# Patient Record
Sex: Male | Born: 1995 | Hispanic: Refuse to answer | Marital: Single | Smoking: Never smoker
Health system: Southern US, Community
[De-identification: ages and names within clinical notes are randomized; demographics above are authoritative.]

---

## 2016-07-10 ENCOUNTER — Ambulatory Visit (INDEPENDENT_AMBULATORY_CARE_PROVIDER_SITE_OTHER): Payer: BLUE CROSS/BLUE SHIELD

## 2016-07-10 ENCOUNTER — Ambulatory Visit (INDEPENDENT_AMBULATORY_CARE_PROVIDER_SITE_OTHER): Payer: BLUE CROSS/BLUE SHIELD | Admitting: Physician Assistant

## 2016-07-10 ENCOUNTER — Encounter: Payer: Self-pay | Admitting: Physician Assistant

## 2016-07-10 VITALS — BP 136/78 | HR 54 | Temp 98.3°F | Resp 18 | Ht 73.23 in | Wt 200.0 lb

## 2016-07-10 DIAGNOSIS — J181 Lobar pneumonia, unspecified organism: Secondary | ICD-10-CM

## 2016-07-10 DIAGNOSIS — R05 Cough: Secondary | ICD-10-CM | POA: Diagnosis not present

## 2016-07-10 DIAGNOSIS — R509 Fever, unspecified: Secondary | ICD-10-CM

## 2016-07-10 DIAGNOSIS — J189 Pneumonia, unspecified organism: Secondary | ICD-10-CM

## 2016-07-10 DIAGNOSIS — R059 Cough, unspecified: Secondary | ICD-10-CM

## 2016-07-10 LAB — POCT CBC
GRANULOCYTE PERCENT: 64.1 % (ref 37–80)
HEMATOCRIT: 42.1 % — AB (ref 43.5–53.7)
HEMOGLOBIN: 14.2 g/dL (ref 14.1–18.1)
Lymph, poc: 1.6 (ref 0.6–3.4)
MCH: 30.5 pg (ref 27–31.2)
MCHC: 33.7 g/dL (ref 31.8–35.4)
MCV: 90.6 fL (ref 80–97)
MID (cbc): 0.5 (ref 0–0.9)
MPV: 7.3 fL (ref 0–99.8)
POC GRANULOCYTE: 3.8 (ref 2–6.9)
POC LYMPH PERCENT: 27.5 %L (ref 10–50)
POC MID %: 8.4 % (ref 0–12)
Platelet Count, POC: 205 10*3/uL (ref 142–424)
RBC: 4.65 M/uL — AB (ref 4.69–6.13)
RDW, POC: 13.1 %
WBC: 5.9 10*3/uL (ref 4.6–10.2)

## 2016-07-10 MED ORDER — AZITHROMYCIN 250 MG PO TABS: ORAL_TABLET | ORAL | 0 refills | Status: AC

## 2016-07-10 MED ORDER — CEFTRIAXONE SODIUM 1 G IJ SOLR
1.0000 g | Freq: Once | INTRAMUSCULAR | Status: AC
  Administered 2016-07-10: 1 g via INTRAMUSCULAR

## 2016-07-10 NOTE — Progress Notes (Signed)
07/10/2016 4:33 PM   DOB: 12/21/1995 / MRN: [id number]  SUBJECTIVE:  Tony Cooper is a 21 y.o. male presenting for weakness that started on 4 days ago.  Tells me this was followed by a high tactile fever and fatigue.  He was able to go back to work but continued to have myalgia and fatigue. Felt the fever coming back yesterday.  Associates cough and also feels that he is somewhat shortness of breath with cough and with exertion. Denies posterior calf pain and leg swelling.  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   He has No Known Allergies.   He  has no past medical history on file.    He  reports that he has never smoked. He has never used smokeless tobacco. He reports that he does not drink alcohol or use drugs. He  has no sexual activity history on file. The patient  has no past surgical history on file.  His family history is not on file.  Review of Systems  Constitutional: Positive for chills, fever and malaise/fatigue.  Respiratory: Negative for cough.   Cardiovascular: Negative for chest pain and leg swelling.  Skin: Negative for rash.  Neurological: Negative for dizziness.    The problem list and medications were reviewed and updated by myself where necessary and exist elsewhere in the encounter.   OBJECTIVE:  BP 136/78 (BP Location: Right Arm, Patient Position: Sitting, Cuff Size: Small)   Pulse (!) 54   Temp 98.3 F (36.8 C) (Oral)   Resp 18   Ht 6' 1.23" (1.86 m)   Wt 200 lb (90.7 kg)   SpO2 97%   BMI 26.22 kg/m   Physical Exam  Constitutional: He appears well-developed. He is active and cooperative.  Non-toxic appearance.  Cardiovascular: Normal rate, regular rhythm, S1 normal, S2 normal, normal heart sounds, intact distal pulses and normal pulses.  Exam reveals no gallop and no friction rub.   No murmur heard. Pulmonary/Chest: Effort normal. No stridor. No tachypnea. No respiratory distress. He has no wheezes. He has no rales.  Abdominal: He  exhibits no distension.  Musculoskeletal: He exhibits no edema.  Neurological: He is alert.  Skin: Skin is warm and dry. He is not diaphoretic. No pallor.  Vitals reviewed.   Results for orders placed or performed in visit on 07/10/16 (from the past 72 hour(s))  POCT CBC     Status: Abnormal   Collection Time: 07/10/16  3:43 PM  Result Value Ref Range   WBC 5.9 4.6 - 10.2 K/uL   Lymph, poc 1.6 0.6 - 3.4   POC LYMPH PERCENT 27.5 10 - 50 %L   MID (cbc) 0.5 0 - 0.9   POC MID % 8.4 0 - 12 %M   POC Granulocyte 3.8 2 - 6.9   Granulocyte percent 64.1 37 - 80 %G   RBC 4.65 (A) 4.69 - 6.13 M/uL   Hemoglobin 14.2 14.1 - 18.1 g/dL   HCT, POC 40.942.1 (A) 81.143.5 - 53.7 %   MCV 90.6 80 - 97 fL   MCH, POC 30.5 27 - 31.2 pg   MCHC 33.7 31.8 - 35.4 g/dL   RDW, POC 91.413.1 %   Platelet Count, POC 205 142 - 424 K/uL   MPV 7.3 0 - 99.8 fL    Dg Chest 2 View  Result Date: 07/10/2016 CLINICAL DATA:  Cough with fever and chills. EXAM: CHEST  2 VIEW COMPARISON:  None. FINDINGS: Patchy densities in the left upper lung  is suggestive for pneumonia. Right lung is clear. Heart and mediastinum are within normal limits. The trachea is midline. No pleural effusions. Bone structures are normal. IMPRESSION: Left upper lobe pneumonia. Electronically Signed   By: Richarda Overlie M.D.   On: 07/10/2016 15:40    ASSESSMENT AND PLAN:  [name] was seen today for cough and shortness of breath.  Diagnoses and all orders for this visit:  Cough -     DG Chest 2 View; Future  Fever, unspecified -     POCT CBC  Pneumonia of left upper lobe due to infectious organism [facility]): Advised that if he is not feeling significantly better in the next 72 hours that I would need to see him back.  Fortunately his vitals look great and CBC is unremarkable.   -     cefTRIAXone (ROCEPHIN) injection 1 g; Inject 1 g into the muscle once. -     azithromycin (ZITHROMAX) 250 MG tablet; Take 2 tabs PO x 1 dose, then 1 tab PO QD x 4 days    The  patient is advised to call or return to clinic if he does not see an improvement in symptoms, or to seek the care of the closest emergency department if he worsens with the above plan.   Deliah Boston, MHS, PA-C [facility] 07/10/2016 4:33 PM

## 2016-07-10 NOTE — Patient Instructions (Addendum)
Wait at least 48 hours before getting back to the field. If you are not feeling completely better in a week then please see the team physician or come back and see me or one of my colleagues.     Dg Chest 2 View  Result Date: 07/10/2016 CLINICAL DATA:  Cough with fever and chills. EXAM: CHEST  2 VIEW COMPARISON:  None. FINDINGS: Patchy densities in the left upper lung is suggestive for pneumonia. Right lung is clear. Heart and mediastinum are within normal limits. The trachea is midline. No pleural effusions. Bone structures are normal. IMPRESSION: Left upper lobe pneumonia. Electronically Signed   By: Richarda OverlieAdam  Henn M.D.   On: 07/10/2016 15:40     IF you received an x-ray today, you will receive an invoice from [facility]. Please contact [facility] at [phone] with questions or concerns regarding your invoice.   IF you received labwork today, you will receive an invoice from [facility]. Please contact [facility] at [phone] with questions or concerns regarding your invoice.   Our billing staff will not be able to assist you with questions regarding bills from these companies.  You will be contacted with the lab results as soon as they are available. The fastest way to get your results is to activate your My Chart account. Instructions are located on the last page of this paperwork. If you have not heard from us regarding the results in 2 weeks, please contact this office.

## 2017-12-18 IMAGING — DX DG CHEST 2V
3 series · 3 of 3 positions shown · non-contrast
Comparison: None.

CLINICAL DATA: Cough with fever and chills.

EXAM:
CHEST  2 VIEW

[chest pa (1 of 2)]
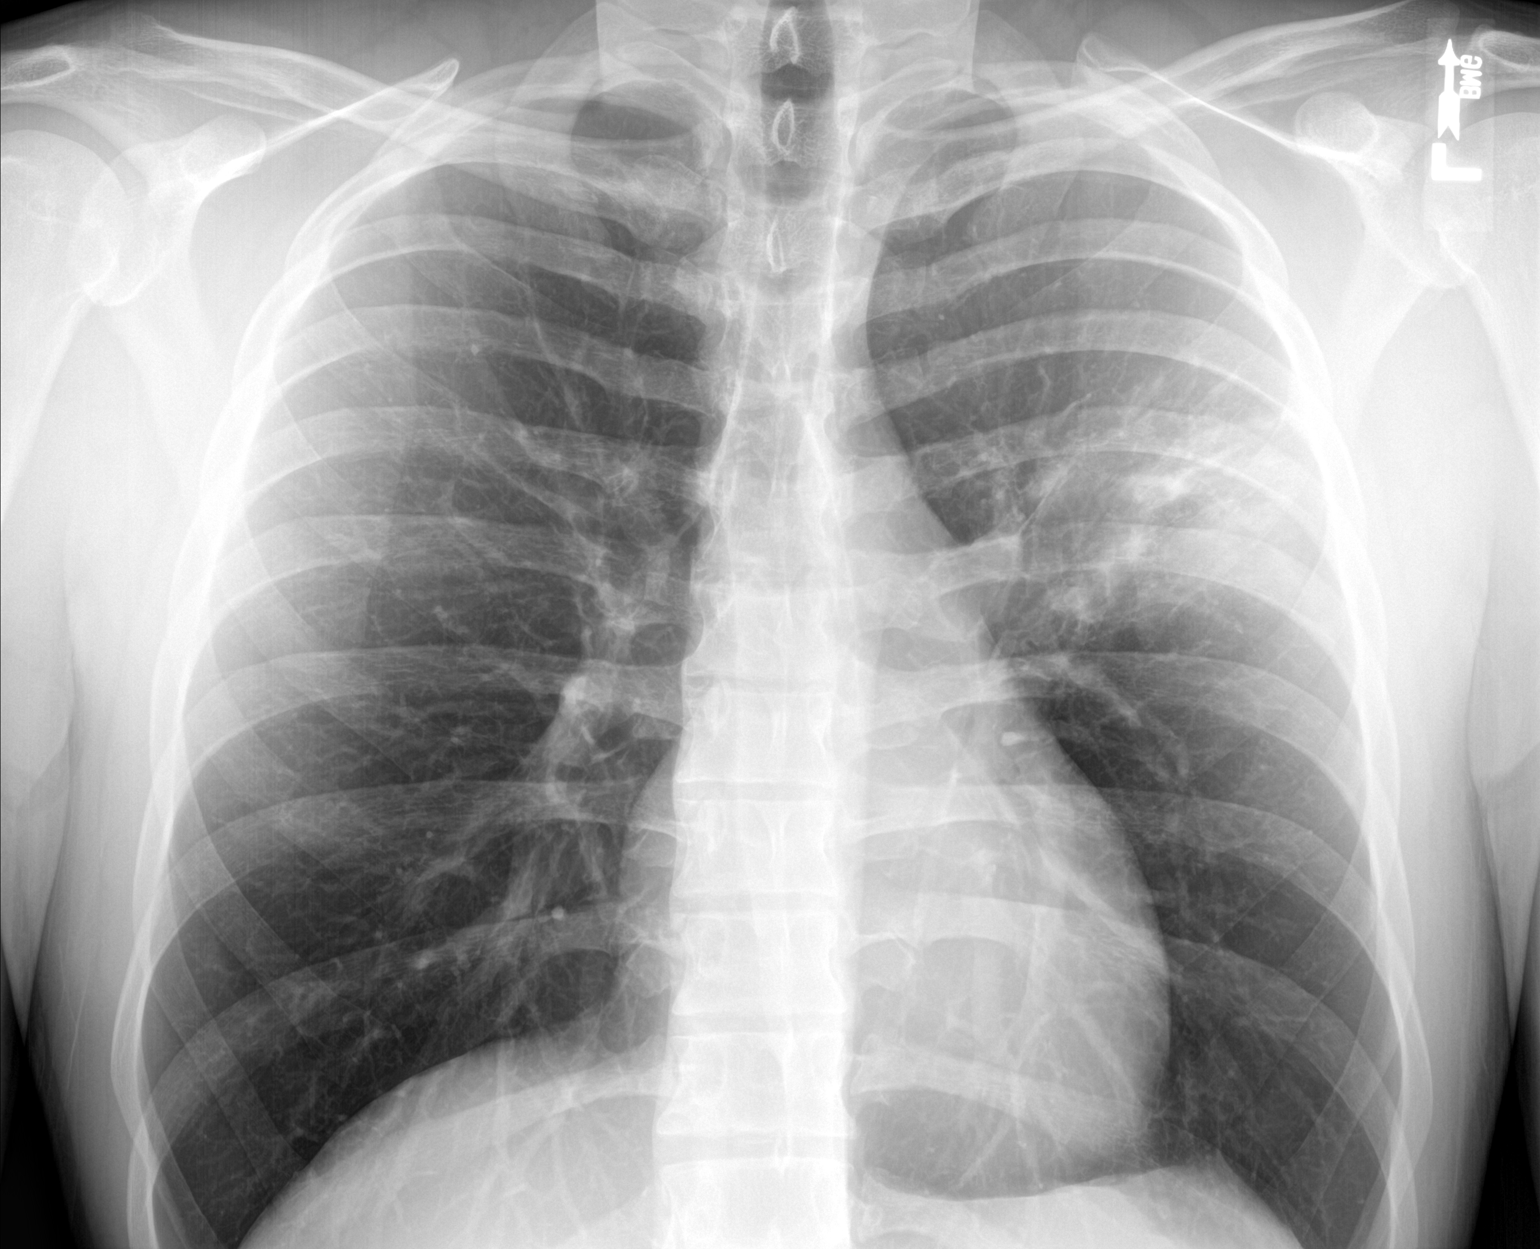

[chest lat]
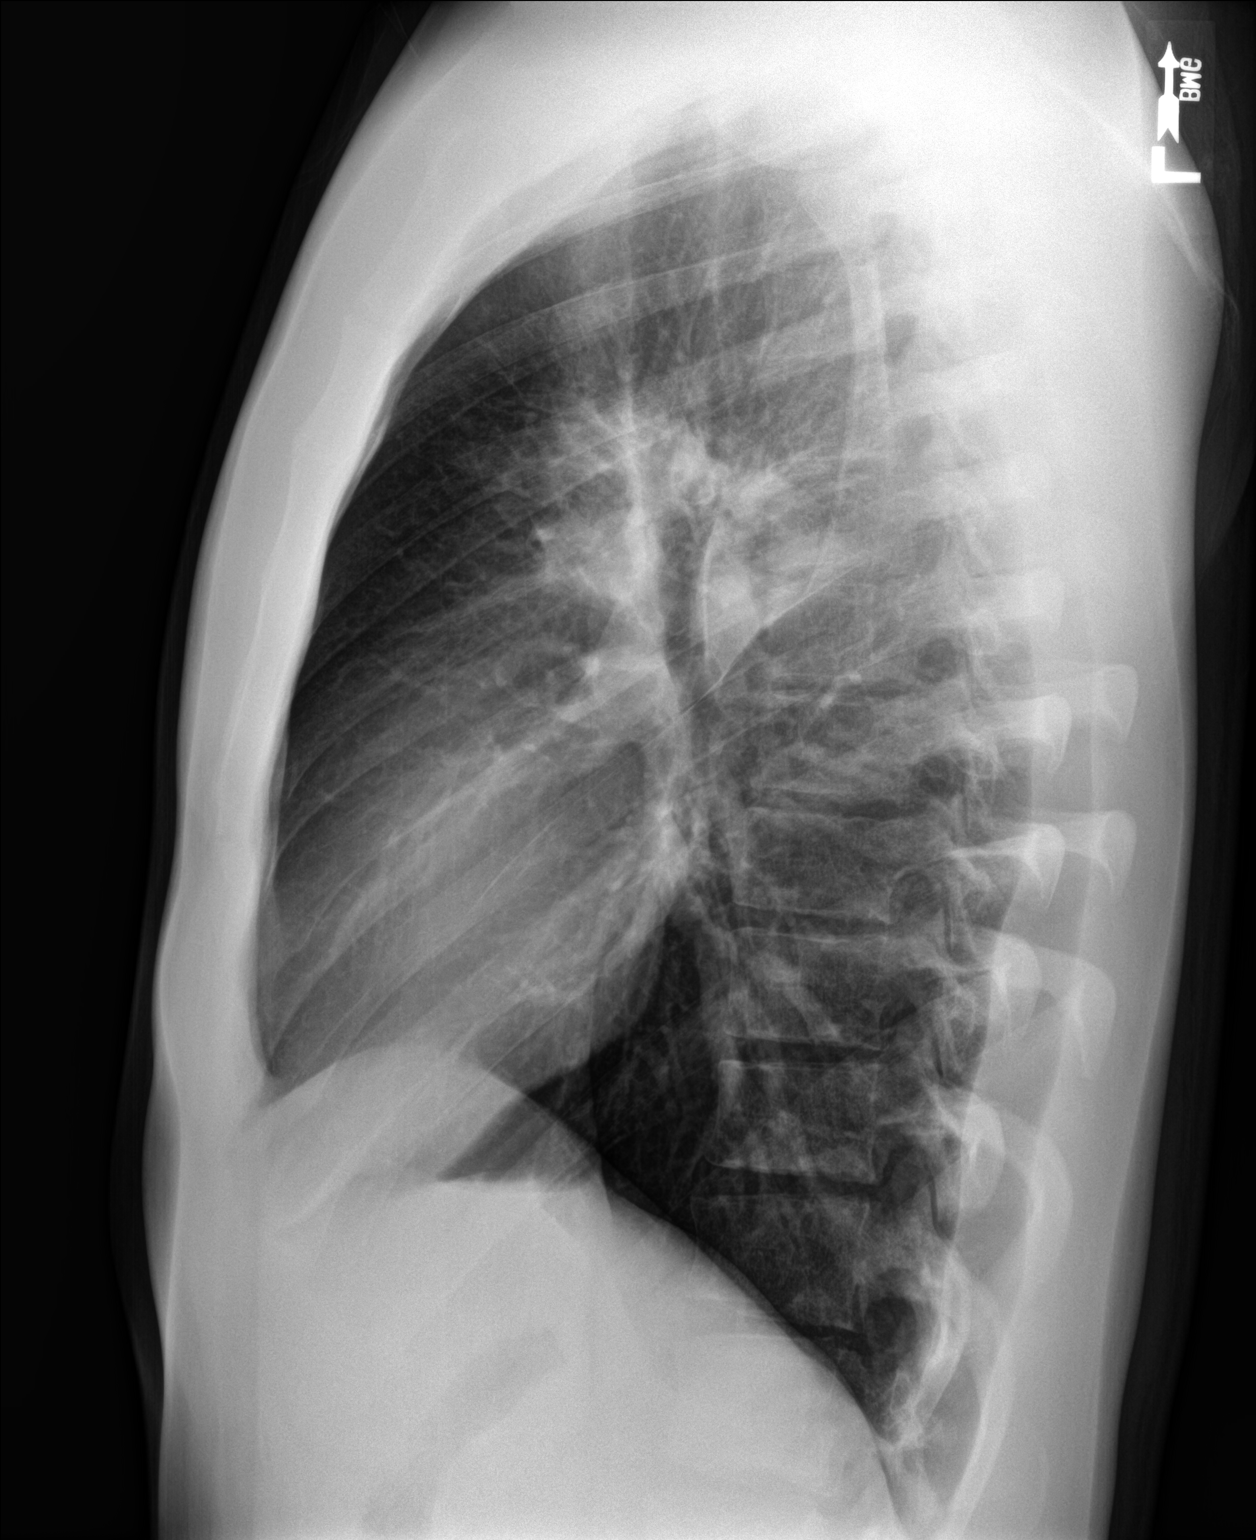

[chest pa (2 of 2)]
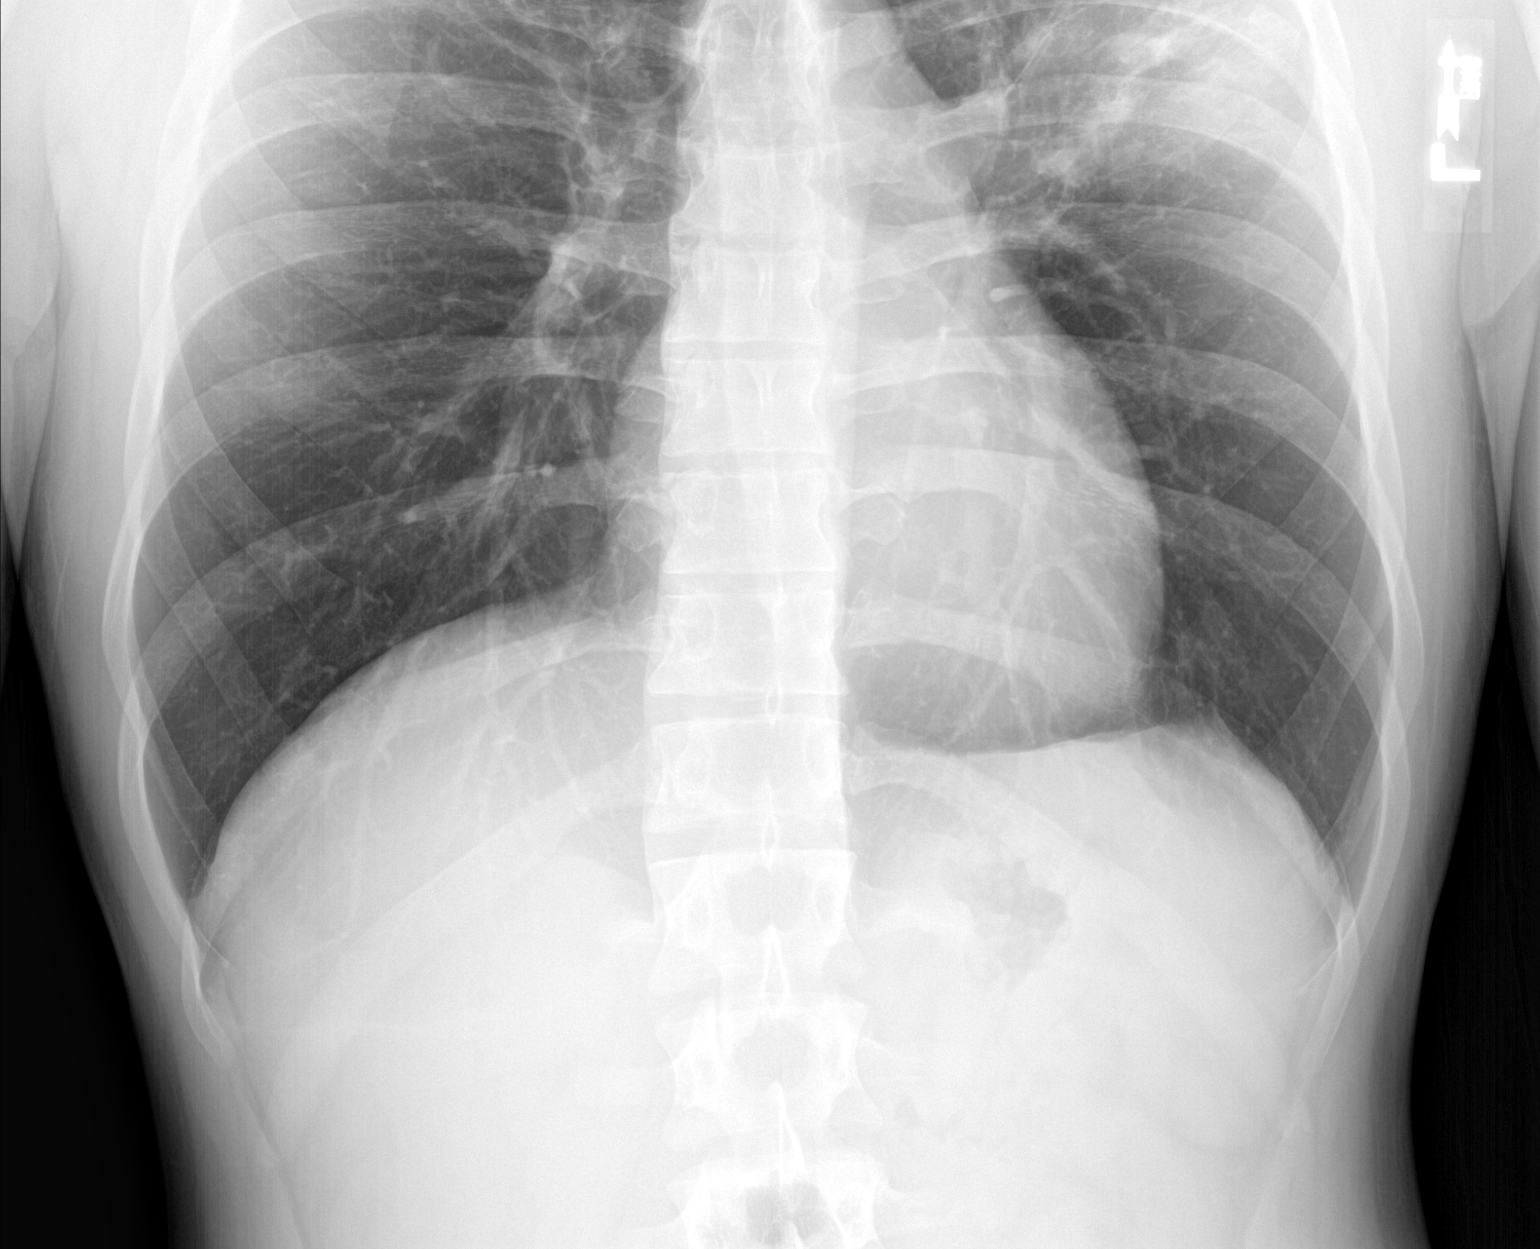

[3 of 3 positions shown; findings below may reference images not displayed]

FINDINGS: Patchy densities in the left upper lung is suggestive for pneumonia.
Right lung is clear. Heart and mediastinum are within normal limits.
The trachea is midline. No pleural effusions. Bone structures are
normal.
IMPRESSION: Left upper lobe pneumonia.
